# Patient Record
Sex: Male | Born: 1975 | Race: Black or African American | Hispanic: No | Marital: Single | Smoking: Current every day smoker
Health system: Southern US, Community
[De-identification: ages and names within clinical notes are randomized; demographics above are authoritative.]

## PROBLEM LIST (undated history)

## (undated) HISTORY — PX: ADENOIDECTOMY: SUR15

---

## 1998-03-07 ENCOUNTER — Emergency Department (HOSPITAL_COMMUNITY): Admission: EM | Admit: 1998-03-07 | Discharge: 1998-03-07 | Payer: Self-pay | Admitting: Emergency Medicine

## 1999-05-13 ENCOUNTER — Emergency Department (HOSPITAL_COMMUNITY): Admission: EM | Admit: 1999-05-13 | Discharge: 1999-05-13 | Payer: Self-pay | Admitting: Emergency Medicine

## 2000-03-15 ENCOUNTER — Emergency Department (HOSPITAL_COMMUNITY): Admission: EM | Admit: 2000-03-15 | Discharge: 2000-03-15 | Payer: Self-pay | Admitting: Emergency Medicine

## 2007-04-15 ENCOUNTER — Emergency Department (HOSPITAL_COMMUNITY): Admission: EM | Admit: 2007-04-15 | Discharge: 2007-04-15 | Payer: Self-pay | Admitting: Emergency Medicine

## 2007-04-23 ENCOUNTER — Emergency Department (HOSPITAL_COMMUNITY): Admission: EM | Admit: 2007-04-23 | Discharge: 2007-04-23 | Payer: Self-pay | Admitting: Emergency Medicine

## 2010-11-04 ENCOUNTER — Emergency Department (HOSPITAL_COMMUNITY): Payer: Self-pay

## 2010-11-04 ENCOUNTER — Emergency Department (HOSPITAL_COMMUNITY)
Admission: EM | Admit: 2010-11-04 | Discharge: 2010-11-04 | Disposition: A | Discharge status: Home / Self Care | Payer: Self-pay | Attending: Emergency Medicine | Admitting: Emergency Medicine

## 2010-11-04 DIAGNOSIS — R059 Cough, unspecified: Secondary | ICD-10-CM | POA: Insufficient documentation

## 2010-11-04 DIAGNOSIS — R05 Cough: Secondary | ICD-10-CM | POA: Insufficient documentation

## 2010-11-04 DIAGNOSIS — R070 Pain in throat: Secondary | ICD-10-CM | POA: Insufficient documentation

## 2010-11-04 DIAGNOSIS — J069 Acute upper respiratory infection, unspecified: Secondary | ICD-10-CM | POA: Insufficient documentation

## 2010-11-04 DIAGNOSIS — IMO0001 Reserved for inherently not codable concepts without codable children: Secondary | ICD-10-CM | POA: Insufficient documentation

## 2010-11-04 DIAGNOSIS — R509 Fever, unspecified: Secondary | ICD-10-CM | POA: Insufficient documentation

## 2010-11-04 DIAGNOSIS — J3489 Other specified disorders of nose and nasal sinuses: Secondary | ICD-10-CM | POA: Insufficient documentation

## 2012-12-27 ENCOUNTER — Emergency Department (HOSPITAL_COMMUNITY): Payer: Self-pay

## 2012-12-27 ENCOUNTER — Emergency Department (HOSPITAL_COMMUNITY)
Admission: EM | Admit: 2012-12-27 | Discharge: 2012-12-27 | Disposition: A | Discharge status: Home / Self Care | Payer: Self-pay | Attending: Emergency Medicine | Admitting: Emergency Medicine

## 2012-12-27 ENCOUNTER — Encounter (HOSPITAL_COMMUNITY): Payer: Self-pay | Admitting: Emergency Medicine

## 2012-12-27 DIAGNOSIS — S6391XA Sprain of unspecified part of right wrist and hand, initial encounter: Secondary | ICD-10-CM

## 2012-12-27 DIAGNOSIS — S60414A Abrasion of right ring finger, initial encounter: Secondary | ICD-10-CM

## 2012-12-27 DIAGNOSIS — Y939 Activity, unspecified: Secondary | ICD-10-CM | POA: Insufficient documentation

## 2012-12-27 DIAGNOSIS — F17200 Nicotine dependence, unspecified, uncomplicated: Secondary | ICD-10-CM | POA: Insufficient documentation

## 2012-12-27 DIAGNOSIS — IMO0002 Reserved for concepts with insufficient information to code with codable children: Secondary | ICD-10-CM | POA: Insufficient documentation

## 2012-12-27 DIAGNOSIS — W2209XA Striking against other stationary object, initial encounter: Secondary | ICD-10-CM | POA: Insufficient documentation

## 2012-12-27 DIAGNOSIS — S6390XA Sprain of unspecified part of unspecified wrist and hand, initial encounter: Secondary | ICD-10-CM | POA: Insufficient documentation

## 2012-12-27 DIAGNOSIS — Y929 Unspecified place or not applicable: Secondary | ICD-10-CM | POA: Insufficient documentation

## 2012-12-27 MED ORDER — ACETAMINOPHEN 500 MG PO TABS: 500.0000 mg | ORAL_TABLET | Freq: Four times a day (QID) | ORAL | Status: DC | PRN

## 2012-12-27 NOTE — ED Notes (Signed)
Patient states he hit a wall Saturday night.  Patient states that the hand started swelling bad on Sunday.  Patient states pain 7/10, dull pain with swelling.

## 2012-12-27 NOTE — ED Provider Notes (Signed)
History     CSN: [id number]  Arrival date & time 12/27/12  0907   First MD Initiated Contact with Patient 12/27/12 0920      Chief Complaint  Patient presents with  . Hand Injury    (Consider location/radiation/quality/duration/timing/severity/associated sxs/prior treatment) HPI Pt is a 37yo male c/o right hand pain associated with right hand swelling that started 2 days ago when pt punched a wall with his right hand. Pt states he was wearing a ring at the time, which caused a minor abrasion to his right ring finger.  Pain in right hand is described as aching and throbbing, worse with movement.  When resting his hand, pain is minimal.  Has not tried anything for pain.  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  Denies fever, chills, warmth or redness of hand.  Denies other injuries.   History reviewed. No pertinent past medical history.  Past Surgical History  Procedure Laterality Date  . Adenoidectomy      No family history on file.  History  Substance Use Topics  . Smoking status: Current Every Day Smoker -- 1.00 packs/day    Types: Cigarettes  . Smokeless tobacco: Not on file  . Alcohol Use: 0.6 oz/week    1 Cans of beer per week      Review of Systems  Musculoskeletal: Positive for myalgias, joint swelling and arthralgias.  Skin: Positive for wound.    Allergies  Review of patient's allergies indicates no known allergies.  Home Medications   Current Outpatient Rx  Name  Route  Sig  Dispense  Refill  . acetaminophen (TYLENOL) 500 MG tablet   Oral   Take 1 tablet (500 mg total) by mouth every 6 (six) hours as needed for pain.   30 tablet   0     BP 94/64  Pulse 81  Temp(Src) 98.3 F (36.8 C) (Oral)  Resp 18  Ht 5\' 8"  (1.727 m)  Wt 135 lb (61.236 kg)  BMI 20.53 kg/m2  SpO2 97%  Physical Exam  Nursing note and vitals reviewed. Constitutional: He appears well-developed and well-nourished. No distress.  HENT:   Head: Normocephalic and atraumatic.  Eyes: Conjunctivae are normal. No scleral icterus.  Neck: Normal range of motion.  Cardiovascular: Normal rate, regular rhythm and normal heart sounds.   Pulmonary/Chest: Effort normal and breath sounds normal. No respiratory distress. He has no wheezes.  Musculoskeletal: Normal range of motion. He exhibits edema ( dorsum of right hand) and tenderness ( right 3rd and 4th mcp).  CMS of right hand in tact   Neurological: He is alert.  Skin: Skin is warm and dry. He is not diaphoretic. No erythema.  Psychiatric: He has a normal mood and affect. His behavior is normal.    ED Course  Procedures (including critical care time)  Labs Reviewed - No data to display Dg Hand Complete Right  12/27/2012  *RADIOLOGY REPORT*  Clinical Data: Hand pain and swelling after punching wall.  RIGHT HAND - COMPLETE 3+ VIEW  Comparison: No comparison studies available.  Findings: No evidence for fracture.  No subluxation or dislocation. Posterior soft tissue swelling is evident.  IMPRESSION: No acute bony findings.   Original Report Authenticated By: Kennith Center, M.D.      1. Hand sprain, right, initial encounter   2. Abrasion of right ring finger, initial encounter       MDM  Pt punched wall with right hand on 12/25/12,  c/o right hand pain and swelling.  Was wearing ring at time.  1cm well healing abrasion on right ring finger, at proximal phalanx.  Swelling of dorsum or right hand with ttp over 3rd and 4th MCP.  No redness or warmth.  Not concerned for infection.  Plain films: no fx.  Rx: tylenol and ibuprofen with hand splint.  F/u with PCP or [facility] in 4 weeks if pain not improving or sooner if symptoms worsening.  Sprains can take several weeks to heal completely.  Advised to return right away if signs of infection. Vitals: unremarkable. Discharged in stable condition.       Junius Finner, PA-C 12/28/12 1147

## 2012-12-28 NOTE — ED Provider Notes (Signed)
Medical screening examination/treatment/procedure(s) were performed by non-physician practitioner and as supervising physician I was immediately available for consultation/collaboration.   Janicia Monterrosa H Sheena Simonis, MD 12/28/12 1448 

## 2017-03-25 ENCOUNTER — Encounter (HOSPITAL_COMMUNITY): Payer: Self-pay | Admitting: Emergency Medicine

## 2017-03-25 DIAGNOSIS — K5792 Diverticulitis of intestine, part unspecified, without perforation or abscess without bleeding: Secondary | ICD-10-CM | POA: Insufficient documentation

## 2017-03-25 DIAGNOSIS — F17210 Nicotine dependence, cigarettes, uncomplicated: Secondary | ICD-10-CM | POA: Insufficient documentation

## 2017-03-25 LAB — CBC
HCT: 38.8 % — ABNORMAL LOW (ref 39.0–52.0)
HEMOGLOBIN: 13.4 g/dL (ref 13.0–17.0)
MCH: 29.9 pg (ref 26.0–34.0)
MCHC: 34.5 g/dL (ref 30.0–36.0)
MCV: 86.6 fL (ref 78.0–100.0)
PLATELETS: 221 10*3/uL (ref 150–400)
RBC: 4.48 MIL/uL (ref 4.22–5.81)
RDW: 11.5 % (ref 11.5–15.5)
WBC: 13.8 10*3/uL — AB (ref 4.0–10.5)

## 2017-03-25 LAB — COMPREHENSIVE METABOLIC PANEL
ALT: 14 U/L — AB (ref 17–63)
ANION GAP: 10 (ref 5–15)
AST: 19 U/L (ref 15–41)
Albumin: 3.5 g/dL (ref 3.5–5.0)
Alkaline Phosphatase: 65 U/L (ref 38–126)
BUN: 6 mg/dL (ref 6–20)
CHLORIDE: 102 mmol/L (ref 101–111)
CO2: 24 mmol/L (ref 22–32)
CREATININE: 0.82 mg/dL (ref 0.61–1.24)
Calcium: 8.8 mg/dL — ABNORMAL LOW (ref 8.9–10.3)
GFR calc Af Amer: >60 mL/min (ref >60)
Glucose, Bld: 94 mg/dL (ref 65–99)
Potassium: 3.8 mmol/L (ref 3.5–5.1)
SODIUM: 136 mmol/L (ref 135–145)
Total Bilirubin: 1.1 mg/dL (ref 0.3–1.2)
Total Protein: 7.2 g/dL (ref 6.5–8.1)

## 2017-03-25 LAB — URINALYSIS, ROUTINE W REFLEX MICROSCOPIC
BILIRUBIN URINE: NEGATIVE
Bacteria, UA: NONE SEEN
GLUCOSE, UA: NEGATIVE mg/dL
KETONES UR: 5 mg/dL — AB
LEUKOCYTES UA: NEGATIVE
Nitrite: NEGATIVE
PROTEIN: NEGATIVE mg/dL
Specific Gravity, Urine: 1.023 (ref 1.005–1.030)
pH: 5 (ref 5.0–8.0)

## 2017-03-25 LAB — LIPASE, BLOOD: LIPASE: 26 U/L (ref 11–51)

## 2017-03-25 MED ORDER — ACETAMINOPHEN 325 MG PO TABS
ORAL_TABLET | ORAL | Status: AC
  Administered 2017-03-25: 650 mg
  Filled 2017-03-25: qty 2

## 2017-03-25 NOTE — ED Triage Notes (Signed)
Pt c/o 9/10 LLQ abd pain with nausea, pt having high temp on triage 100.2.

## 2017-03-26 ENCOUNTER — Emergency Department (HOSPITAL_COMMUNITY): Payer: Self-pay

## 2017-03-26 ENCOUNTER — Emergency Department (HOSPITAL_COMMUNITY)
Admission: EM | Admit: 2017-03-26 | Discharge: 2017-03-26 | Disposition: A | Discharge status: Home / Self Care | Payer: Self-pay | Attending: Emergency Medicine | Admitting: Emergency Medicine

## 2017-03-26 DIAGNOSIS — K5792 Diverticulitis of intestine, part unspecified, without perforation or abscess without bleeding: Secondary | ICD-10-CM

## 2017-03-26 MED ORDER — SODIUM CHLORIDE 0.9 % IV BOLUS (SEPSIS)
1000.0000 mL | Freq: Once | INTRAVENOUS | Status: AC
  Administered 2017-03-26: 1000 mL via INTRAVENOUS

## 2017-03-26 MED ORDER — IOPAMIDOL (ISOVUE-300) INJECTION 61%
100.0000 mL | Freq: Once | INTRAVENOUS | Status: AC | PRN
  Administered 2017-03-26: 100 mL via INTRAVENOUS

## 2017-03-26 MED ORDER — METRONIDAZOLE 500 MG PO TABS
500.0000 mg | ORAL_TABLET | Freq: Once | ORAL | Status: AC
  Administered 2017-03-26: 500 mg via ORAL
  Filled 2017-03-26: qty 1

## 2017-03-26 MED ORDER — ONDANSETRON HCL 4 MG PO TABS: 4.0000 mg | ORAL_TABLET | Freq: Four times a day (QID) | ORAL | 0 refills | Status: DC

## 2017-03-26 MED ORDER — CIPROFLOXACIN HCL 500 MG PO TABS
500.0000 mg | ORAL_TABLET | Freq: Once | ORAL | Status: AC
  Administered 2017-03-26: 500 mg via ORAL
  Filled 2017-03-26: qty 1

## 2017-03-26 MED ORDER — FENTANYL CITRATE (PF) 100 MCG/2ML IJ SOLN
50.0000 ug | Freq: Once | INTRAMUSCULAR | Status: AC
Start: 2017-03-26 — End: 2017-03-26
  Administered 2017-03-26: 50 ug via INTRAVENOUS
  Filled 2017-03-26: qty 2

## 2017-03-26 MED ORDER — METRONIDAZOLE 500 MG PO TABS
500.0000 mg | ORAL_TABLET | Freq: Two times a day (BID) | ORAL | 0 refills | Status: DC
Start: 2017-03-26 — End: 2018-10-28

## 2017-03-26 MED ORDER — CIPROFLOXACIN HCL 500 MG PO TABS: 500.0000 mg | ORAL_TABLET | Freq: Two times a day (BID) | ORAL | 0 refills | Status: DC

## 2017-03-26 MED ORDER — OXYCODONE-ACETAMINOPHEN 5-325 MG PO TABS: 2.0000 | ORAL_TABLET | ORAL | 0 refills | Status: DC | PRN

## 2017-03-26 NOTE — ED Provider Notes (Signed)
41 year old male signed out to me at shift change pending CT scan. Patient's presentation is most consistent with diverticulitis. This appears uncomplicated. He is well-appearing here in no acute distress. Patient nontoxic with no episodes of vomiting here. Patient is a candidate for outpatient management. He'll be started on Cipro, metronidazole, Percocet as needed for pain. He will follow-up if symptoms persist or worsen. He was given strict return precautions, he verbalized understanding and agreement to today's plan had no further questions or concerns the time discharge.  Patient CT scan results were discussed with him including need for follow-up colonoscopy.  Vitals:   03/26/17 0730 03/26/17 0800  BP: 116/72 111/71  Pulse: 82 76  Resp:    Temp:         Eyvonne MechanicHedges, Pankaj Haack, PA-C 03/26/17 0831    Ward, Layla MawKristen N, DO 03/26/17 2256

## 2017-03-26 NOTE — ED Notes (Signed)
Called pt for vitals in waiting room with no response.

## 2017-03-26 NOTE — Discharge Instructions (Signed)
Please read attached information. If you experience any new or worsening signs or symptoms please return to the emergency room for evaluation. Please follow-up with your primary care provider or specialist as discussed. Please use medication prescribed only as directed and discontinue taking if you have any concerning signs or symptoms.   °

## 2017-03-26 NOTE — ED Notes (Signed)
Pt ambulatory at departure. Voices understanding of discharge instructions and follow up information.

## 2017-03-26 NOTE — ED Provider Notes (Signed)
[facility]-EMERGENCY DEPT Provider Note   CSN: [id number] Arrival date & time: 03/25/17  2028     History   Chief Complaint Chief Complaint  Patient presents with  . Abdominal Pain    HPI Richard Goodwin is a 41 y.o. male with no past medical history presenting with progressive onset left lower quadrant pain starting 4 days ago, he reports constipation and straining with last bowel movement 2 days ago, the pain has been progressing since. He has taken ibuprofen on Tuesday with modest relief. No blood in the stool, patient reports still passing gas. He endorses subjective fever and chills. States that he's been drinking plenty of water attempting stay well hydrated. Denies nausea, vomiting, testicular pain, testicular swelling, penile discharge, dysuria, hematuria, or other symptoms. Denies any history of abdominal surgeries.  HPI  History reviewed. No pertinent past medical history.  There are no active problems to display for this patient.   Past Surgical History:  Procedure Laterality Date  . ADENOIDECTOMY         Home Medications    Prior to Admission medications   Medication Sig Start Date End Date Taking? Authorizing Provider  acetaminophen (TYLENOL) 500 MG tablet Take 1 tablet (500 mg total) by mouth every 6 (six) hours as needed for pain. Patient not taking: Reported on 03/26/2017 12/27/12   Rolla Plate    Family History History reviewed. No pertinent family history.  Social History Social History  Substance Use Topics  . Smoking status: Current Every Day Smoker    Packs/day: 1.00    Types: Cigarettes  . Smokeless tobacco: Not on file  . Alcohol use 0.6 oz/week    1 Cans of beer per week     Allergies   Patient has no known allergies.   Review of Systems Review of Systems  Constitutional: Positive for chills and fever. Negative for diaphoresis.  HENT: Negative for trouble swallowing.   Eyes: Negative for pain and visual disturbance.    Respiratory: Negative for cough, choking, chest tightness, shortness of breath, wheezing and stridor.   Cardiovascular: Negative for chest pain and palpitations.  Gastrointestinal: Positive for abdominal pain and constipation. Negative for abdominal distention, blood in stool, diarrhea, nausea and vomiting.  Genitourinary: Negative for difficulty urinating, discharge, dysuria, flank pain, frequency, hematuria, penile pain, penile swelling, scrotal swelling, testicular pain and urgency.  Musculoskeletal: Negative for arthralgias, back pain and myalgias.  Skin: Negative for color change, pallor and rash.  Neurological: Negative for dizziness, seizures, syncope, weakness and light-headedness.     Physical Exam Updated Vital Signs BP 116/69   Pulse 81   Temp 100.2 F (37.9 C) (Oral)   Resp 18   Ht 5\' 7"  (1.702 m)   Wt 56.2 kg (124 lb)   SpO2 98%   BMI 19.42 kg/m   Physical Exam  Constitutional: He appears well-developed and well-nourished. No distress.  Patient with low-grade fever, nontoxic-appearing, lying comfortably in bed in no acute distress.  HENT:  Head: Normocephalic and atraumatic.  Eyes: Conjunctivae are normal.  Neck: Normal range of motion. Neck supple.  Cardiovascular: Normal rate, regular rhythm, normal heart sounds and intact distal pulses.   No murmur heard. Pulmonary/Chest: Effort normal and breath sounds normal. No respiratory distress. He has no wheezes. He exhibits no tenderness.  Abdominal: Soft. He exhibits no distension and no mass. There is tenderness. There is no rebound and no guarding.  Flat contour, active bowel sounds. abdomen is supple and tender to palpation of  the left lower quadrant. No peritoneal signs  No costovertebral angle tenderness. Negative murphy's sign Negative McBurney's point tenderness   Musculoskeletal: Normal range of motion. He exhibits no edema.  Neurological: He is alert.  Skin: Skin is warm and dry. He is not diaphoretic.   Psychiatric: He has a normal mood and affect.  Nursing note and vitals reviewed.    ED Treatments / Results  Labs (all labs ordered are listed, but only abnormal results are displayed) Labs Reviewed  COMPREHENSIVE METABOLIC PANEL - Abnormal; Notable for the following:       Result Value   Calcium 8.8 (*)    ALT 14 (*)    All other components within normal limits  CBC - Abnormal; Notable for the following:    WBC 13.8 (*)    HCT 38.8 (*)    All other components within normal limits  URINALYSIS, ROUTINE W REFLEX MICROSCOPIC - Abnormal; Notable for the following:    Hgb urine dipstick MODERATE (*)    Ketones, ur 5 (*)    Squamous Epithelial / LPF 0-5 (*)    All other components within normal limits  LIPASE, BLOOD    EKG  EKG Interpretation None       Radiology Ct Abdomen Pelvis W Contrast  Result Date: 03/26/2017 CLINICAL DATA:  Left lower quadrant abdominal pain. EXAM: CT ABDOMEN AND PELVIS WITH CONTRAST TECHNIQUE: Multidetector CT imaging of the abdomen and pelvis was performed using the standard protocol following bolus administration of intravenous contrast. CONTRAST:  100mL ISOVUE-300 IOPAMIDOL (ISOVUE-300) INJECTION 61% COMPARISON:  None. FINDINGS: Lower chest: No acute abnormality. Hepatobiliary: Subcentimeter low-density lesion in the left hepatic lobe is too small to characterize. No gallstones, gallbladder wall thickening, or biliary dilatation. Pancreas: Unremarkable. No pancreatic ductal dilatation or surrounding inflammatory changes. Spleen: Normal in size without focal abnormality. Adrenals/Urinary Tract: Adrenal glands are unremarkable. Kidneys are normal, without renal calculi, focal lesion, or hydronephrosis. Bladder is unremarkable. Stomach/Bowel: There is a focal area wall thickening and pericolonic inflammation surrounding an inflamed diverticulum in the left lower quadrant at the junction of the descending and sigmoid colon. No evidence of perforation or  abscess. The stomach and small bowel are unremarkable.  No obstruction. Vascular/Lymphatic: No significant vascular findings are present. No enlarged abdominal or pelvic lymph nodes. Reproductive: Prostate is unremarkable. Other: No abdominal wall hernia or abnormality. Small amount of fluid in the left paracolic gutter. Musculoskeletal: No acute or significant osseous findings. IMPRESSION: 1. Acute uncomplicated diverticulitis in the left lower quadrant at the junction of the descending and sigmoid colon. In the non-urgent setting following complete resolution of the patient's symptoms, colonoscopy may be warranted to exclude the unlikely possibility of an underlying neoplastic lesion. Electronically Signed   By: Obie DredgeWilliam T Derry M.D.   On: 03/26/2017 07:32    Procedures Procedures (including critical care time)  Medications Ordered in ED Medications  acetaminophen (TYLENOL) 325 MG tablet (650 mg  Given 03/25/17 2109)  sodium chloride 0.9 % bolus 1,000 mL (1,000 mLs Intravenous New Bag/Given 03/26/17 [id number])  fentaNYL (SUBLIMAZE) injection 50 mcg (50 mcg Intravenous Given 03/26/17 0628)  iopamidol (ISOVUE-300) 61 % injection 100 mL (100 mLs Intravenous Contrast Given 03/26/17 0651)     Initial Impression / Assessment and Plan / ED Course  I have reviewed the triage vital signs and the nursing notes.  Pertinent labs & imaging results that were available during my care of the patient were reviewed by me and considered in my medical decision making (see  chart for details).    Patient presented with left lower quadrant pain for 4 days with fevers and elevated white count.  I am concerned for acute diverticulitis ordered CT abdomen pelvis. Patient's pain was managed while in the emergency department. Patient was given IV fluids  Labs otherwise unremarkable. Patient is well-appearing, nontoxic with normal stable vital signs.  Transferred patient care at end of shift to [facility], PA-C, pending CT  results.  Anticipate discharge home with antibiotic therapy if uncomplicated diverticulitis. It is appropriate if other acute findings.  Final Clinical Impressions(s) / ED Diagnoses   Final diagnoses:  None    New Prescriptions New Prescriptions   No medications on file     Gregary Cromer 03/26/17 1610    Ward, Layla Maw, DO 03/26/17 2256

## 2017-03-26 NOTE — ED Notes (Signed)
Pt reports left lower abd pain that began on Sunday. Pt reports his last bowel movement was on Saturday.

## 2017-11-16 ENCOUNTER — Other Ambulatory Visit: Payer: Self-pay

## 2017-11-16 ENCOUNTER — Encounter (HOSPITAL_COMMUNITY): Payer: Self-pay | Admitting: Emergency Medicine

## 2017-11-16 ENCOUNTER — Ambulatory Visit (HOSPITAL_COMMUNITY)
Admission: EM | Admit: 2017-11-16 | Discharge: 2017-11-16 | Disposition: A | Discharge status: Home / Self Care | Payer: Managed Care, Other (non HMO) | Attending: Family Medicine | Admitting: Family Medicine

## 2017-11-16 DIAGNOSIS — M545 Low back pain, unspecified: Secondary | ICD-10-CM

## 2017-11-16 MED ORDER — PREDNISONE 20 MG PO TABS: ORAL_TABLET | ORAL | 0 refills | Status: DC

## 2017-11-16 MED ORDER — CYCLOBENZAPRINE HCL 10 MG PO TABS: 10.0000 mg | ORAL_TABLET | Freq: Two times a day (BID) | ORAL | 0 refills | Status: DC | PRN

## 2017-11-16 NOTE — ED Provider Notes (Signed)
[facility]   [id number] 11/16/17 Arrival Time: 1712     [facility]   [id number] 11/16/17 Arrival Time: 1712   SUBJECTIVE:  Richard Goodwin is a 42 y.o. male who presents to the urgent care with complaint of midline low back pain for 2 days.  He felt the pain for the first time yesterday morning just getting out of bed.  He went to work at [employer] yesterday and was able to get through the day but today the pain is worse.  There is no radiation of the pain, no fever, no urinary symptoms, no abdominal pain.  He has not had this pain before.  He cannot think of anything that he might of done on Saturday to provoke the pain.     History reviewed. No pertinent past medical history. Family History  Problem Relation Age of Onset  . Healthy Father    Social History   Socioeconomic History  . Marital status: Single    Spouse name: Not on file  . Number of children: Not on file  . Years of education: Not on file  . Highest education level: Not on file  Occupational History  . Not on file  Social Needs  . Financial resource strain: Not on file  . Food insecurity:    Worry: Not on file    Inability: Not on file  . Transportation needs:    Medical: Not on file    Non-medical: Not on file  Tobacco Use  . Smoking status: Current Every Day Smoker    Packs/day: 1.00    Types: Cigarettes  Substance and Sexual Activity  . Alcohol use: Yes    Alcohol/week: 0.6 oz    Types: 1 Cans of beer per week  . Drug use: No  . Sexual activity: Not on file  Lifestyle  . Physical activity:    Days per week: Not on file    Minutes per session: Not on file  . Stress: Not on file  Relationships  . Social connections:    Talks on phone: Not on file    Gets together: Not on file    Attends religious service: Not on file    Active member of club or organization: Not on file    Attends meetings of clubs or organizations: Not on file    Relationship status: Not on file    . Intimate partner violence:    Fear of current or ex partner: Not on file    Emotionally abused: Not on file    Physically abused: Not on file    Forced sexual activity: Not on file  Other Topics Concern  . Not on file  Social History Narrative  . Not on file   No outpatient medications have been marked as taking for the 11/16/17 encounter [facility] Encounter).   No Known Allergies    ROS: As per HPI, remainder of ROS negative.   OBJECTIVE:   Vitals:   11/16/17 1847  BP: 109/69  Pulse: 79  Resp: 18  Temp: 98.8 F (37.1 C)  TempSrc: Oral  SpO2: 100%     General appearance: alert; no distress Eyes: PERRL; EOMI; conjunctiva normal HENT: normocephalic; atraumatic; TMs normal, canal normal, external ears normal without trauma; nasal mucosa normal; oral mucosa normal Neck: supple Lungs: clear to auscultation bilaterally Heart: regular rate and rhythm Abdomen: soft, non-tender; bowel sounds normal; no masses or organomegaly; no guarding or rebound tenderness Back: no CVA tenderness; bending over precipitates the  pain as does bilateral SLR Extremities: no cyanosis or edema; symmetrical with no gross deformities Skin: warm and dry Neurologic: normal gait; grossly normal Psychological: alert and cooperative; normal mood and affect      Labs:  Results for orders placed or performed during the hospital encounter of 03/26/17  Lipase, blood  Result Value Ref Range   Lipase 26 11 - 51 U/L  Comprehensive metabolic panel  Result Value Ref Range   Sodium 136 135 - 145 mmol/L   Potassium 3.8 3.5 - 5.1 mmol/L   Chloride 102 101 - 111 mmol/L   CO2 24 22 - 32 mmol/L   Glucose, Bld 94 65 - 99 mg/dL   BUN 6 6 - 20 mg/dL   Creatinine, Ser 0.98 0.61 - 1.24 mg/dL   Calcium 8.8 (L) 8.9 - 10.3 mg/dL   Total Protein 7.2 6.5 - 8.1 g/dL   Albumin 3.5 3.5 - 5.0 g/dL   AST 19 15 - 41 U/L   ALT 14 (L) 17 - 63 U/L   Alkaline Phosphatase 65 38 - 126 U/L   Total Bilirubin 1.1 0.3 -  1.2 mg/dL   GFR calc non Af Amer >60 >60 mL/min   GFR calc Af Amer >60 >60 mL/min   Anion gap 10 5 - 15  CBC  Result Value Ref Range   WBC 13.8 (H) 4.0 - 10.5 K/uL   RBC 4.48 4.22 - 5.81 MIL/uL   Hemoglobin 13.4 13.0 - 17.0 g/dL   HCT 11.9 (L) 14.7 - 82.9 %   MCV 86.6 78.0 - 100.0 fL   MCH 29.9 26.0 - 34.0 pg   MCHC 34.5 30.0 - 36.0 g/dL   RDW 56.2 13.0 - 86.5 %   Platelets 221 150 - 400 K/uL  Urinalysis, Routine w reflex microscopic  Result Value Ref Range   Color, Urine YELLOW YELLOW   APPearance CLEAR CLEAR   Specific Gravity, Urine 1.023 1.005 - 1.030   pH 5.0 5.0 - 8.0   Glucose, UA NEGATIVE NEGATIVE mg/dL   Hgb urine dipstick MODERATE (A) NEGATIVE   Bilirubin Urine NEGATIVE NEGATIVE   Ketones, ur 5 (A) NEGATIVE mg/dL   Protein, ur NEGATIVE NEGATIVE mg/dL   Nitrite NEGATIVE NEGATIVE   Leukocytes, UA NEGATIVE NEGATIVE   RBC / HPF 6-30 0 - 5 RBC/hpf   WBC, UA 0-5 0 - 5 WBC/hpf   Bacteria, UA NONE SEEN NONE SEEN   Squamous Epithelial / LPF 0-5 (A) NONE SEEN   Mucus PRESENT     Labs Reviewed - No data to display  No results found.     ASSESSMENT & PLAN:  1. Acute midline low back pain without sciatica     Meds ordered this encounter  Medications  . predniSONE (DELTASONE) 20 MG tablet    Sig: Two daily with food    Dispense:  10 tablet    Refill:  0  . cyclobenzaprine (FLEXERIL) 10 MG tablet    Sig: Take 1 tablet (10 mg total) by mouth 2 (two) times daily as needed for muscle spasms.    Dispense:  20 tablet    Refill:  0    Reviewed expectations re: course of current medical issues. Questions answered. Outlined signs and symptoms indicating need for more acute intervention. Patient verbalized understanding. After Visit Summary given.    Procedures:       History reviewed. No pertinent past medical history. Family History  Problem Relation Age of Onset  . Healthy Father  Social History   Socioeconomic History  . Marital status: Single      Spouse name: Not on file  . Number of children: Not on file  . Years of education: Not on file  . Highest education level: Not on file  Occupational History  . Not on file  Social Needs  . Financial resource strain: Not on file  . Food insecurity:    Worry: Not on file    Inability: Not on file  . Transportation needs:    Medical: Not on file    Non-medical: Not on file  Tobacco Use  . Smoking status: Current Every Day Smoker    Packs/day: 1.00    Types: Cigarettes  Substance and Sexual Activity  . Alcohol use: Yes    Alcohol/week: 0.6 oz    Types: 1 Cans of beer per week  . Drug use: No  . Sexual activity: Not on file  Lifestyle  . Physical activity:    Days per week: Not on file    Minutes per session: Not on file  . Stress: Not on file  Relationships  . Social connections:    Talks on phone: Not on file    Gets together: Not on file    Attends religious service: Not on file    Active member of club or organization: Not on file    Attends meetings of clubs or organizations: Not on file    Relationship status: Not on file  . Intimate partner violence:    Fear of current or ex partner: Not on file    Emotionally abused: Not on file    Physically abused: Not on file    Forced sexual activity: Not on file  Other Topics Concern  . Not on file  Social History Narrative  . Not on file   No outpatient medications have been marked as taking for the 11/16/17 encounter [facility](Hospital Encounter).   No Known Allergies    ROS: As per HPI, remainder of ROS negative.   OBJECTIVE:   Vitals:   11/16/17 1847  BP: 109/69  Pulse: 79  Resp: 18  Temp: 98.8 F (37.1 C)  TempSrc: Oral  SpO2: 100%     General appearance: alert; no distress Eyes: PERRL; EOMI; conjunctiva normal HENT: normocephalic; atraumatic; TMs normal, canal normal, external ears normal without trauma; nasal mucosa normal; oral mucosa normal Neck: supple Lungs: clear to auscultation  bilaterally Heart: regular rate and rhythm Abdomen: soft, non-tender; bowel sounds normal; no masses or organomegaly; no guarding or rebound tenderness Back: no CVA tenderness Extremities: no cyanosis or edema; symmetrical with no gross deformities Skin: warm and dry Neurologic: normal gait; grossly normal Psychological: alert and cooperative; normal mood and affect      Labs:  Results for orders placed or performed during the hospital encounter of 03/26/17  Lipase, blood  Result Value Ref Range   Lipase 26 11 - 51 U/L  Comprehensive metabolic panel  Result Value Ref Range   Sodium 136 135 - 145 mmol/L   Potassium 3.8 3.5 - 5.1 mmol/L   Chloride 102 101 - 111 mmol/L   CO2 24 22 - 32 mmol/L   Glucose, Bld 94 65 - 99 mg/dL   BUN 6 6 - 20 mg/dL   Creatinine, Ser 4.090.82 0.61 - 1.24 mg/dL   Calcium 8.8 (L) 8.9 - 10.3 mg/dL   Total Protein 7.2 6.5 - 8.1 g/dL   Albumin 3.5 3.5 - 5.0 g/dL   AST 19 15 -  41 U/L   ALT 14 (L) 17 - 63 U/L   Alkaline Phosphatase 65 38 - 126 U/L   Total Bilirubin 1.1 0.3 - 1.2 mg/dL   GFR calc non Af Amer >60 >60 mL/min   GFR calc Af Amer >60 >60 mL/min   Anion gap 10 5 - 15  CBC  Result Value Ref Range   WBC 13.8 (H) 4.0 - 10.5 K/uL   RBC 4.48 4.22 - 5.81 MIL/uL   Hemoglobin 13.4 13.0 - 17.0 g/dL   HCT 57.8 (L) 46.9 - 62.9 %   MCV 86.6 78.0 - 100.0 fL   MCH 29.9 26.0 - 34.0 pg   MCHC 34.5 30.0 - 36.0 g/dL   RDW 52.8 41.3 - 24.4 %   Platelets 221 150 - 400 K/uL  Urinalysis, Routine w reflex microscopic  Result Value Ref Range   Color, Urine YELLOW YELLOW   APPearance CLEAR CLEAR   Specific Gravity, Urine 1.023 1.005 - 1.030   pH 5.0 5.0 - 8.0   Glucose, UA NEGATIVE NEGATIVE mg/dL   Hgb urine dipstick MODERATE (A) NEGATIVE   Bilirubin Urine NEGATIVE NEGATIVE   Ketones, ur 5 (A) NEGATIVE mg/dL   Protein, ur NEGATIVE NEGATIVE mg/dL   Nitrite NEGATIVE NEGATIVE   Leukocytes, UA NEGATIVE NEGATIVE   RBC / HPF 6-30 0 - 5 RBC/hpf   WBC, UA 0-5 0  - 5 WBC/hpf   Bacteria, UA NONE SEEN NONE SEEN   Squamous Epithelial / LPF 0-5 (A) NONE SEEN   Mucus PRESENT     Labs Reviewed - No data to display  No results found.     ASSESSMENT & PLAN:  1. Acute midline low back pain without sciatica     Meds ordered this encounter  Medications  . predniSONE (DELTASONE) 20 MG tablet    Sig: Two daily with food    Dispense:  10 tablet    Refill:  0  . cyclobenzaprine (FLEXERIL) 10 MG tablet    Sig: Take 1 tablet (10 mg total) by mouth 2 (two) times daily as needed for muscle spasms.    Dispense:  20 tablet    Refill:  0    Reviewed expectations re: course of current medical issues. Questions answered. Outlined signs and symptoms indicating need for more acute intervention. Patient verbalized understanding. After Visit Summary given.    Procedures:      Elvina Sidle, MD 11/16/17 1905

## 2017-11-16 NOTE — ED Triage Notes (Signed)
Complains of back pain that started Sunday morning.  No known injury.  Lower back pain, denies pain, numbness or tingling in legs

## 2017-11-16 NOTE — Discharge Instructions (Addendum)
Return on Wednesday if pain is not improving.

## 2018-03-27 IMAGING — CT CT ABD-PELV W/ CM
2 of 5 series · 15 of 46 positions shown, 17 images · IV contrast (APPLIED)
Comparison: None.

CLINICAL DATA: Left lower quadrant abdominal pain.

EXAM:
CT ABDOMEN AND PELVIS WITH CONTRAST
TECHNIQUE: Multidetector CT imaging of the abdomen and pelvis was performed
using the standard protocol following bolus administration of
intravenous contrast.
CONTRAST:  100mL NOSV8O-TRR IOPAMIDOL (NOSV8O-TRR) INJECTION 61%

[Series 3: abdomen 5.0 · axial · 0.66mm/px · z∈[+819,+1169]mm · 12 of 82 slices shown, 14 images]
[im 6/82  soft-tissue]
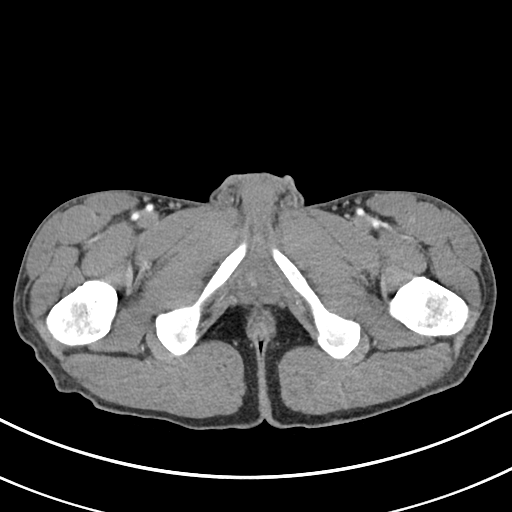
[im 6/82  bone]
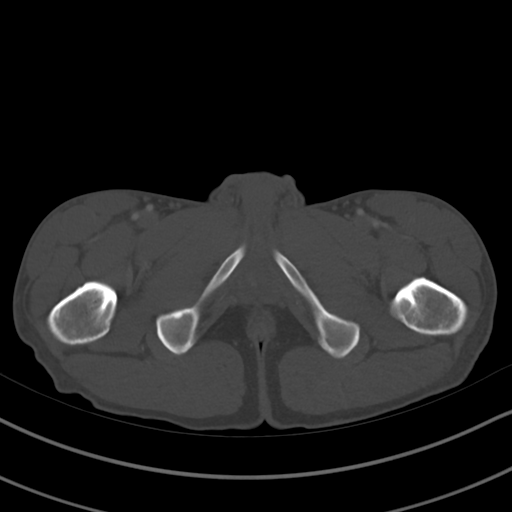
[im 11/82  soft-tissue]
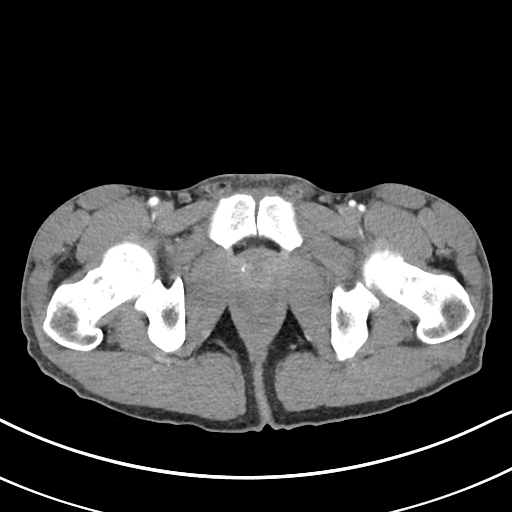
[im 21/82  soft-tissue]
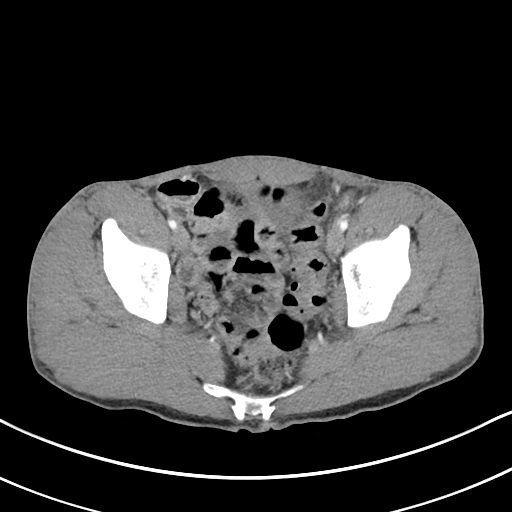
[im 26/82  soft-tissue]
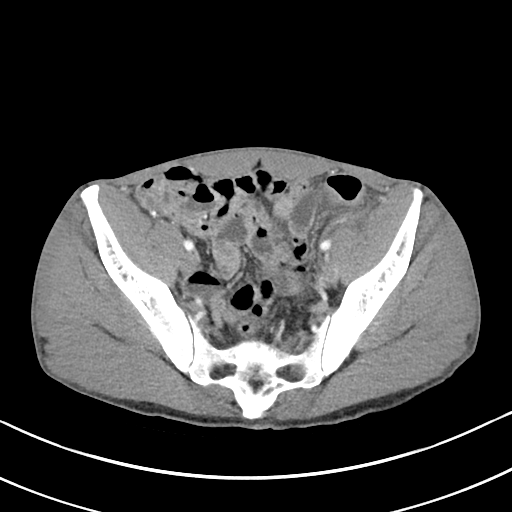
[im 31/82  soft-tissue]
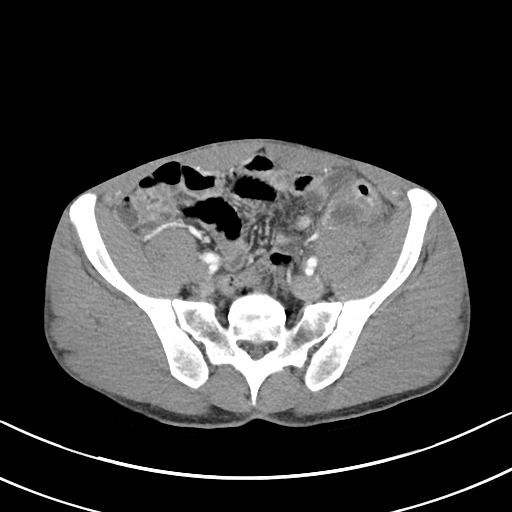
[im 36/82  soft-tissue]
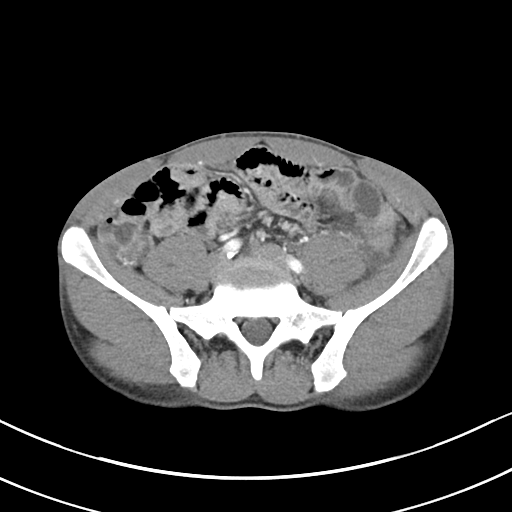
[im 46/82  soft-tissue]
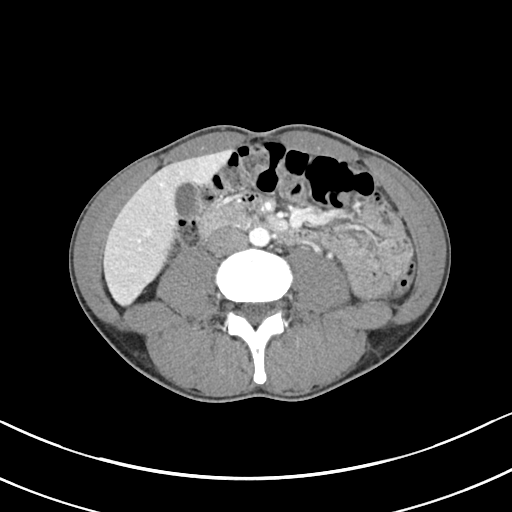
[im 51/82  soft-tissue]
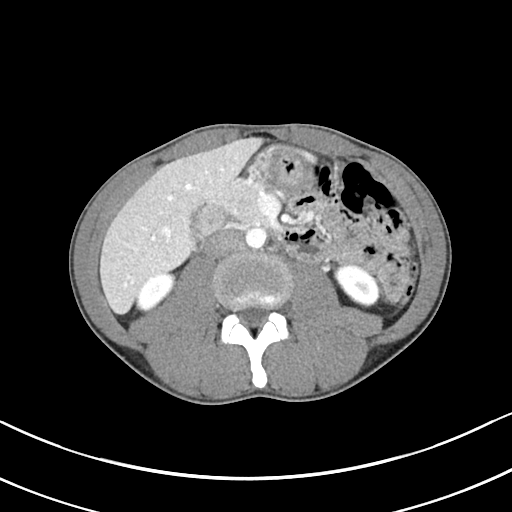
[im 56/82  soft-tissue]
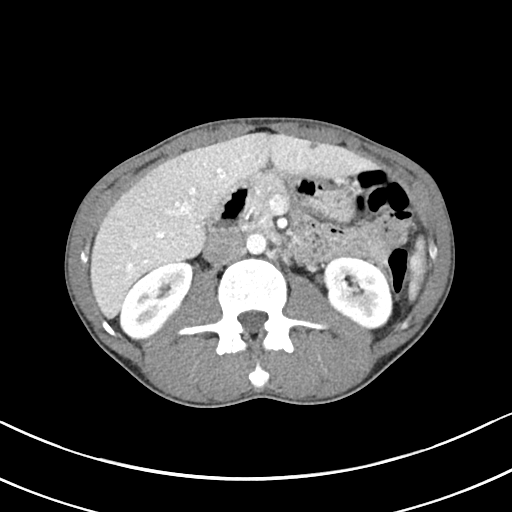
[im 56/82  bone]
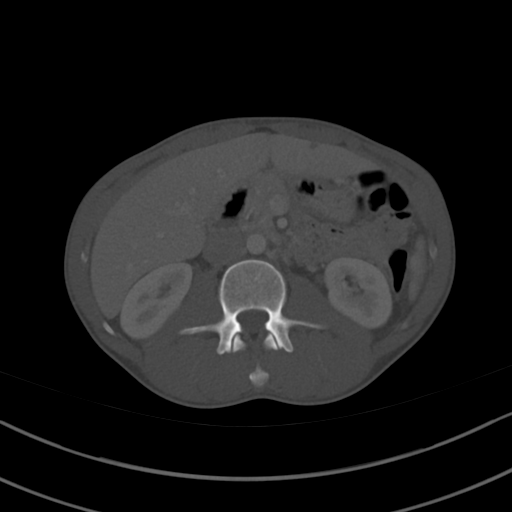
[im 61/82  soft-tissue]
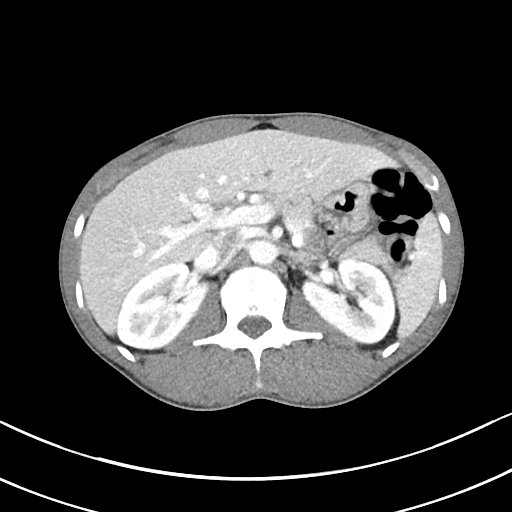
[im 71/82  soft-tissue]
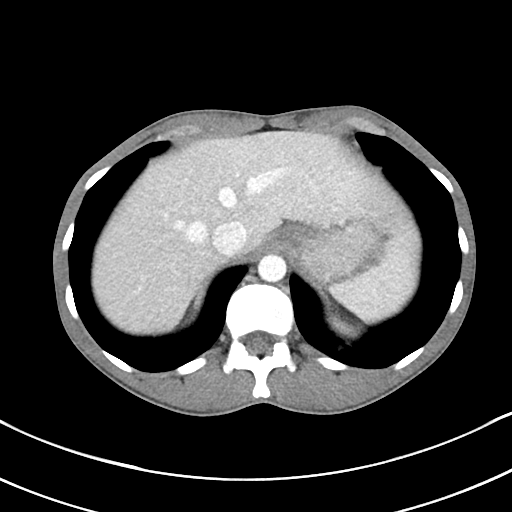
[im 76/82  soft-tissue]
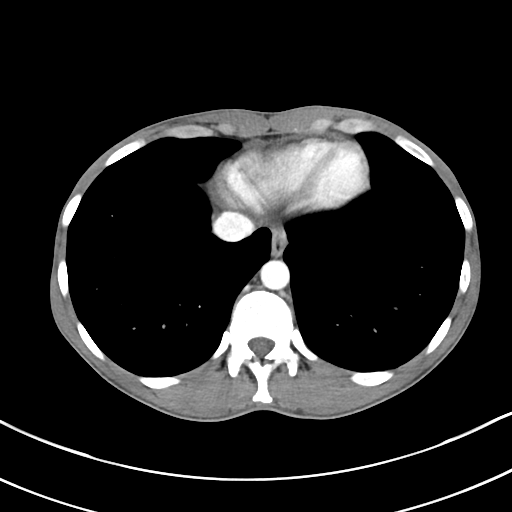

[Series 6: abdomen 3.0 mpr cor · coronal · 0.55mm/px · 3 of 70 slices shown]
[im 24/70  soft-tissue]
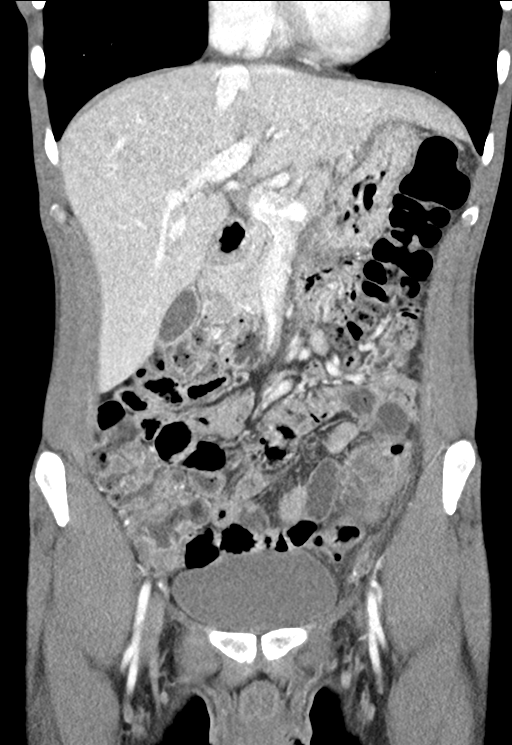
[im 31/70  soft-tissue]
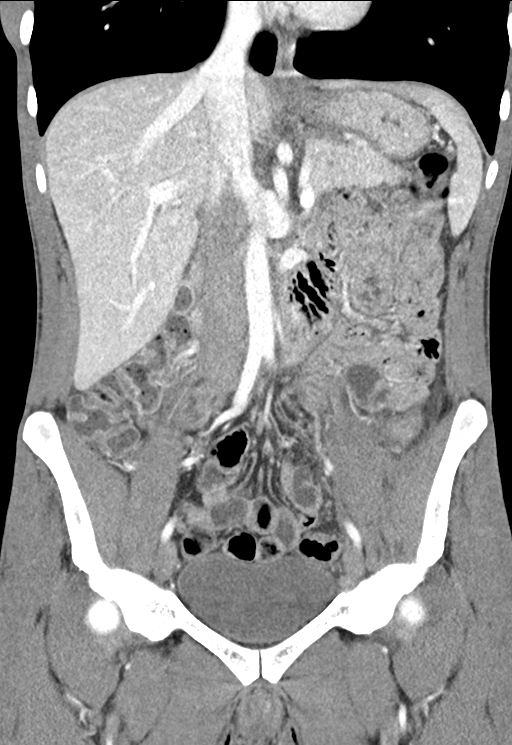
[im 39/70  soft-tissue]
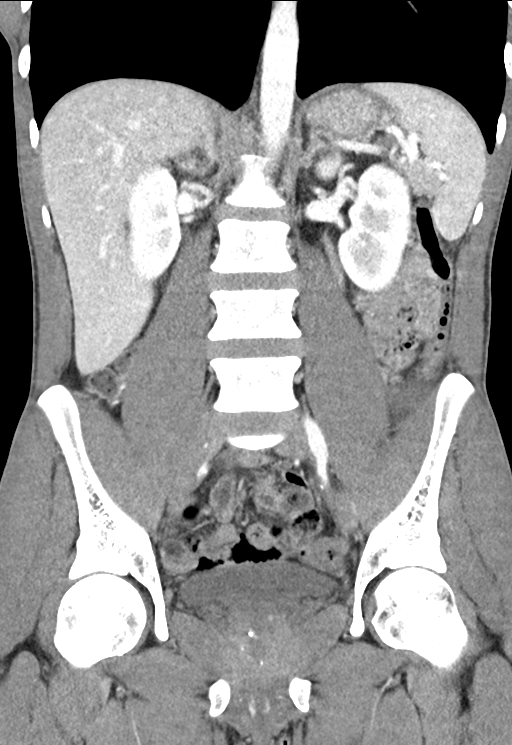

[15 of 46 positions shown; findings below may reference images not displayed]

FINDINGS: Lower chest: No acute abnormality.

Hepatobiliary: Subcentimeter low-density lesion in the left hepatic
lobe is too small to characterize. No gallstones, gallbladder wall
thickening, or biliary dilatation.

Pancreas: Unremarkable. No pancreatic ductal dilatation or
surrounding inflammatory changes.

Spleen: Normal in size without focal abnormality.

Adrenals/Urinary Tract: Adrenal glands are unremarkable. Kidneys are
normal, without renal calculi, focal lesion, or hydronephrosis.
Bladder is unremarkable.

Stomach/Bowel: There is a focal area wall thickening and pericolonic
inflammation surrounding an inflamed diverticulum in the left lower
quadrant at the junction of the descending and sigmoid colon. No
evidence of perforation or abscess.

The stomach and small bowel are unremarkable.  No obstruction.

Vascular/Lymphatic: No significant vascular findings are present. No
enlarged abdominal or pelvic lymph nodes.

Reproductive: Prostate is unremarkable.

Other: No abdominal wall hernia or abnormality. Small amount of
fluid in the left paracolic gutter.

Musculoskeletal: No acute or significant osseous findings.
IMPRESSION: 1. Acute uncomplicated diverticulitis in the left lower quadrant at
the junction of the descending and sigmoid colon. In the non-urgent
setting following complete resolution of the patient's symptoms,
colonoscopy may be warranted to exclude the unlikely possibility of
an underlying neoplastic lesion.

## 2018-10-28 ENCOUNTER — Ambulatory Visit (HOSPITAL_COMMUNITY)
Admission: EM | Admit: 2018-10-28 | Discharge: 2018-10-28 | Disposition: A | Discharge status: Home / Self Care | Payer: Managed Care, Other (non HMO) | Attending: Family Medicine | Admitting: Family Medicine

## 2018-10-28 ENCOUNTER — Other Ambulatory Visit: Payer: Self-pay

## 2018-10-28 ENCOUNTER — Encounter (HOSPITAL_COMMUNITY): Payer: Self-pay | Admitting: Emergency Medicine

## 2018-10-28 DIAGNOSIS — J029 Acute pharyngitis, unspecified: Secondary | ICD-10-CM

## 2018-10-28 DIAGNOSIS — Z720 Tobacco use: Secondary | ICD-10-CM

## 2018-10-28 LAB — POCT RAPID STREP A: Streptococcus, Group A Screen (Direct): NEGATIVE

## 2018-10-28 MED ORDER — PENICILLIN V POTASSIUM 500 MG PO TABS: 500.0000 mg | ORAL_TABLET | Freq: Three times a day (TID) | ORAL | 0 refills | Status: AC

## 2018-10-28 NOTE — ED Triage Notes (Signed)
Pt presents to [facility] for assessment of sore throat x 3 days with no other associated symptoms.

## 2018-10-28 NOTE — Discharge Instructions (Signed)
You may take 500mg  Tylenol with ibuprofen 600mg  every 6 hours for pain and inflammation. Hydrate well with water, ~2 liters per day.

## 2018-10-28 NOTE — ED Provider Notes (Signed)
  MRN: [id number] DOB: [birth year]  Subjective:   Richard Goodwin is a 43 y.o. male presenting for 3 day history of moderate-severe constant throat pain. Has tried APAP, cough drops, gargle salt water. Does not take chronic medications. Smokes ~1ppd.    No Known Allergies  History reviewed. No pertinent past medical history.   Past Surgical History:  Procedure Laterality Date  . ADENOIDECTOMY      Review of Systems  Constitutional: Positive for malaise/fatigue. Negative for fever.  HENT: Negative for congestion, ear pain and sinus pain.   Eyes: Negative for blurred vision, double vision, discharge and redness.  Respiratory: Negative for cough, hemoptysis, shortness of breath and wheezing.   Cardiovascular: Negative for chest pain.  Gastrointestinal: Negative for abdominal pain, diarrhea, nausea and vomiting.  Genitourinary: Negative for dysuria, flank pain and hematuria.  Musculoskeletal: Negative for myalgias.  Skin: Negative for rash.  Neurological: Negative for weakness and headaches.  Psychiatric/Behavioral: Negative for depression and substance abuse.    Objective:   Vitals: BP 120/69 (BP Location: Right Arm)   Pulse 95   Temp (!) 97.3 F (36.3 C) (Temporal)   Resp 16   SpO2 98%   Physical Exam Constitutional:      General: He is not in acute distress.    Appearance: Normal appearance. He is normal weight. He is not ill-appearing.  HENT:     Head: Normocephalic and atraumatic.     Right Ear: Tympanic membrane, ear canal and external ear normal. There is no impacted cerumen.     Left Ear: Tympanic membrane, ear canal and external ear normal. There is no impacted cerumen.     Nose: Nose normal. No congestion or rhinorrhea.     Mouth/Throat:     Mouth: Mucous membranes are moist.     Pharynx: Oropharynx is clear. Posterior oropharyngeal erythema present. No oropharyngeal exudate or uvula swelling.  Eyes:     General: No scleral icterus.       Right eye: No  discharge.        Left eye: No discharge.     Extraocular Movements: Extraocular movements intact.     Conjunctiva/sclera: Conjunctivae normal.     Pupils: Pupils are equal, round, and reactive to light.  Neck:     Musculoskeletal: Normal range of motion and neck supple. No neck rigidity or muscular tenderness.  Cardiovascular:     Rate and Rhythm: Normal rate.  Pulmonary:     Effort: Pulmonary effort is normal.  Neurological:     General: No focal deficit present.     Mental Status: He is alert and oriented to person, place, and time.  Psychiatric:        Mood and Affect: Mood normal.        Behavior: Behavior normal.     Results for orders placed or performed during the hospital encounter of 10/28/18 (from the past 24 hour(s))  POCT rapid strep A [facility])     Status: None   Collection Time: 10/28/18 12:10 PM  Result Value Ref Range   Streptococcus, Group A Screen (Direct) NEGATIVE NEGATIVE    Assessment and Plan :   Pharyngitis, unspecified etiology  Start penicillin to cover for strep pharyngitis. Throat culture pending.  Use supportive care otherwise. Counseled patient on potential for adverse effects with medications prescribed today, patient verbalized understanding. ER and return-to-clinic precautions discussed, patient verbalized understanding.    Wallis Bamberg, PA-C 10/28/18 1248

## 2018-10-30 LAB — CULTURE, GROUP A STREP (THRC)
